# Patient Record
Sex: Female | Born: 1985 | Race: Black or African American | Hispanic: No | Marital: Single | Smoking: Never smoker
Health system: Southern US, Community
[De-identification: ages and names within clinical notes are randomized; demographics above are authoritative.]

---

## 2012-02-23 ENCOUNTER — Ambulatory Visit: Payer: 59

## 2012-02-24 ENCOUNTER — Ambulatory Visit (INDEPENDENT_AMBULATORY_CARE_PROVIDER_SITE_OTHER): Payer: 59 | Admitting: Family Medicine

## 2012-02-24 VITALS — BP 98/70 | HR 88 | Temp 99.1°F | Resp 18 | Ht 69.0 in | Wt 215.0 lb

## 2012-02-24 DIAGNOSIS — N898 Other specified noninflammatory disorders of vagina: Secondary | ICD-10-CM

## 2012-02-24 DIAGNOSIS — L738 Other specified follicular disorders: Secondary | ICD-10-CM

## 2012-02-24 DIAGNOSIS — R1030 Lower abdominal pain, unspecified: Secondary | ICD-10-CM

## 2012-02-24 DIAGNOSIS — A499 Bacterial infection, unspecified: Secondary | ICD-10-CM

## 2012-02-24 DIAGNOSIS — B9689 Other specified bacterial agents as the cause of diseases classified elsewhere: Secondary | ICD-10-CM

## 2012-02-24 DIAGNOSIS — Z3009 Encounter for other general counseling and advice on contraception: Secondary | ICD-10-CM

## 2012-02-24 DIAGNOSIS — R109 Unspecified abdominal pain: Secondary | ICD-10-CM

## 2012-02-24 DIAGNOSIS — Z113 Encounter for screening for infections with a predominantly sexual mode of transmission: Secondary | ICD-10-CM

## 2012-02-24 DIAGNOSIS — Z23 Encounter for immunization: Secondary | ICD-10-CM

## 2012-02-24 DIAGNOSIS — N760 Acute vaginitis: Secondary | ICD-10-CM

## 2012-02-24 DIAGNOSIS — L739 Follicular disorder, unspecified: Secondary | ICD-10-CM

## 2012-02-24 LAB — POCT WET PREP WITH KOH
Clue Cells Wet Prep HPF POC: 100
Trichomonas, UA: NEGATIVE
Yeast Wet Prep HPF POC: NEGATIVE

## 2012-02-24 MED ORDER — METRONIDAZOLE 500 MG PO TABS: 500.0000 mg | ORAL_TABLET | Freq: Two times a day (BID) | ORAL | Status: DC

## 2012-02-24 MED ORDER — NORGESTIM-ETH ESTRAD TRIPHASIC 0.18/0.215/0.25 MG-35 MCG PO TABS: 1.0000 | ORAL_TABLET | Freq: Every day | ORAL | Status: DC

## 2012-02-24 NOTE — Progress Notes (Signed)
Reviewed and agree.

## 2012-02-24 NOTE — Progress Notes (Signed)
[address]   [place], [place]  [address]   [phone]  Subjective:    Patient ID: Sylvia Bridges, female    DOB: Oct 18, 1985, 27 y.o.   MRN: [id number]  HPIThis 27 y.o. female presents for evaluation of vaginal discharge, knots in suprapubic region, lower abdominal pain.  No fever/chills/sweats. No nausea/vomiting/diarrhea/constipation.  Dysuria one week ago; no urgency, frequency, hematuria, flank pain.  Vaginal discharge two weeks.  Yellow-white discharge with odor creamy; +itching vaginally; no burning.  No new sexual partner; current partner 4-5 months.  No history of STD.  Last screening 12/2011.  LMP 02-11-12 normal.  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  Pap due this month.    2.  Immunizations:  has not received flu vaccine yet at work.   Review of Systems  Constitutional: Negative for fever, chills, diaphoresis and fatigue.  Gastrointestinal: Positive for abdominal pain. Negative for nausea, vomiting, diarrhea, constipation and abdominal distention.  Genitourinary: Positive for vaginal discharge, genital sores and pelvic pain. Negative for dysuria, urgency, frequency, hematuria, flank pain, vaginal bleeding, vaginal pain and menstrual problem.  Skin: Positive for wound.        History reviewed. No pertinent past medical history.  History reviewed. No pertinent past surgical history.  Prior to Admission medications   Not on File    No Known Allergies  History   Social History  . Marital Status: Single    Spouse Name: N/A    Number of Children: N/A  . Years of Education: N/A   Occupational History  . Not on file.   Social History Main Topics  . Smoking status: Never Smoker   . Smokeless tobacco: Not on file  . Alcohol Use: No  . Drug Use: No  . Sexually Active: Yes   Other Topics Concern  . 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 Diabetes Mother   . Hypertension Father   . Diabetes Maternal Grandmother   . Cancer Maternal Grandfather   . Cancer Paternal Grandfather   . Hypertension Paternal Grandfather     Objective:   Physical Exam  Nursing note and vitals reviewed. Constitutional: She is oriented to person, place, and time. She appears well-developed and well-nourished. No distress.  Cardiovascular: Normal rate and regular rhythm.   Pulmonary/Chest: Effort normal and breath sounds normal.  Abdominal: Soft. Bowel sounds are normal. There is no tenderness. There is no rebound and no guarding.  Genitourinary: Uterus normal. There is no rash, tenderness or lesion on the right labia. There is no rash, tenderness or lesion on the left labia. No erythema, tenderness or bleeding around the vagina. No foreign body around the vagina. Vaginal discharge found.       White yellow discharge small amount with foul odor.  Neurological: She is alert and oriented to person, place, and time.  Skin: She is not diaphoretic.       L inguinal fold with follicular prominence 5mm without fluctuants, tenderness.  No pustule.  Psychiatric: She has a normal mood and affect. Her behavior is normal.    INFLUENZA VACCINE ADMINISTERED.  Results for orders placed in visit on 02/24/12  POCT WET PREP WITH KOH      Component Value Range   Trichomonas, UA Negative     Clue Cells Wet Prep HPF POC 100%     Epithelial Wet Prep HPF POC 3-6     Yeast Wet Prep HPF POC neg     Bacteria Wet Prep HPF POC 2+     RBC Wet Prep HPF POC 0-1     WBC Wet Prep HPF POC neg     KOH Prep POC Negative      Assessment & Plan:   1. Vaginal discharge  POCT Wet Prep with KOH, GC/chlamydia probe amp, genital, RPR,  HIV antibody  2. Lower abdominal pain  POCT Wet Prep with KOH, GC/chlamydia probe amp, genital, RPR, HIV antibody  3. Screen for STD (sexually transmitted disease)  GC/chlamydia probe amp, genital, RPR, HIV antibody  4. Need for prophylactic vaccination and inoculation against influenza  Flu vaccine greater than or equal to 3yo preservative free IM  5. Bacterial vaginosis    6. Folliculitis    7. General counseling and advice on female contraception       1. Bacterial Vaginosis:  New. Rx for Metronidazole 500mg  bid x 7 days.  GC/Chlam pending.   2.  STD screening:  New. Counseling provided. Recommend regular condom use.  Obtain GC/Chlam/RPR/HIV. 3.  Folliculitis/follicular prominence:  New.  L inguinal fold; reassurance provided.  Heat to area if becomes painful. 4.  Contraception Counseling and Management:  New.  Rx for Orthotricyclen provided. 5. S/p influenza vaccine.    Meds ordered this encounter  Medications  . Norgestimate-Ethinyl Estradiol Triphasic (ORTHO TRI-CYCLEN, 28,) 0.18/0.215/0.25 MG-35 MCG tablet    Sig: Take 1 tablet by mouth daily.    Dispense:  1 Package    Refill:  11  . metroNIDAZOLE (FLAGYL) 500 MG tablet    Sig: Take 1 tablet (500 mg total) by mouth 2 (two) times daily with a meal. DO NOT CONSUME ALCOHOL WHILE TAKING THIS MEDICATION.    Dispense:  14 tablet    Refill:  0

## 2012-02-24 NOTE — Patient Instructions (Addendum)
1. Vaginal discharge  POCT Wet Prep with KOH, GC/chlamydia probe amp, genital, RPR, HIV antibody  2. Lower abdominal pain  POCT Wet Prep with KOH, GC/chlamydia probe amp, genital, RPR, HIV antibody  3. Screen for STD (sexually transmitted disease)  GC/chlamydia probe amp, genital, RPR, HIV antibody  4. Need for prophylactic vaccination and inoculation against influenza  Flu vaccine greater than or equal to 25yo preservative free IM  5. Bacterial vaginosis    6. Folliculitis    7. General counseling and advice on female contraception

## 2012-02-25 LAB — GC/CHLAMYDIA PROBE AMP: GC Probe RNA: NEGATIVE

## 2014-12-01 ENCOUNTER — Emergency Department
Admission: EM | Admit: 2014-12-01 | Discharge: 2014-12-01 | Disposition: A | Discharge status: Home / Self Care | Payer: 59 | Source: Home / Self Care | Attending: Family Medicine | Admitting: Family Medicine

## 2014-12-01 ENCOUNTER — Emergency Department (INDEPENDENT_AMBULATORY_CARE_PROVIDER_SITE_OTHER): Payer: 59

## 2014-12-01 ENCOUNTER — Encounter: Payer: Self-pay | Admitting: *Deleted

## 2014-12-01 DIAGNOSIS — M25562 Pain in left knee: Secondary | ICD-10-CM

## 2014-12-01 DIAGNOSIS — M7652 Patellar tendinitis, left knee: Secondary | ICD-10-CM

## 2014-12-01 MED ORDER — MELOXICAM 7.5 MG PO TABS: 15.0000 mg | ORAL_TABLET | Freq: Every day | ORAL | Status: DC

## 2014-12-01 NOTE — ED Notes (Signed)
Pt c/o LT knee pain and swelling intermittently x 6 mths after exercising. She has taken IBF with some relief.

## 2014-12-01 NOTE — ED Provider Notes (Signed)
CSN: [id number]     Arrival date & time 12/01/14  1802 History   First MD Initiated Contact with Patient 12/01/14 1838     Chief Complaint  Patient presents with  . Knee Pain   (Consider location/radiation/quality/duration/timing/severity/associated sxs/prior Treatment) HPI Pt is a 29yo female presenting [facility] with c/o intermittent, gradually worsening Left anterior knee pain that started 6 months ago.  Pain is worse after exercising. Pt does a mix of trail running, running on the treadmill and Zumba dance classes.  She states she use to walk the trail but started to run it and noticed the pain when she runs.  Denies known injury. Pain has been gradual. Improves with ibuprofen and rest. Pt bought a knee brace yesterday that provides some relief. Denies any other injuries. No prior knee injuries or surgeries.  History reviewed. No pertinent past medical history. History reviewed. No pertinent past surgical history. Family History  Problem Relation Age of Onset  . Diabetes Mother   . Hypertension Father   . Diabetes Maternal Grandmother   . Cancer Maternal Grandfather   . Cancer Paternal Grandfather   . Hypertension Paternal Grandfather    Social History  Substance Use Topics  . Smoking status: Never Smoker   . Smokeless tobacco: None  . Alcohol Use: No   OB History    No data available     Review of Systems  Musculoskeletal: Positive for myalgias, joint swelling and arthralgias.       Left knee  Skin: Negative for color change, rash and wound.    Allergies  Review of patient's allergies indicates no known allergies.  Home Medications   Prior to Admission medications   Medication Sig Start Date End Date Taking? Authorizing Provider  meloxicam (MOBIC) 7.5 MG tablet Take 2 tablets (15 mg total) by mouth daily. 15mg  (2 tabs) daily for 5 days, 7.5mg  (1 tab) daily for pain 12/01/14   Junius FinnerErin O'Malley, PA-C  Norgestimate-Ethinyl Estradiol Triphasic (ORTHO TRI-CYCLEN, 28,)  0.18/0.215/0.25 MG-35 MCG tablet Take 1 tablet by mouth daily. 02/24/12   Ethelda ChickKristi M Smith, MD   Meds Ordered and Administered this Visit  Medications - No data to display  BP 112/68 mmHg  Pulse 86  Temp(Src) 98.3 F (36.8 C) (Oral)  Resp 16  Ht 5\' 9"  (1.753 m)  Wt 214 lb (97.07 kg)  BMI 31.59 kg/m2  SpO2 100%  LMP 11/23/2014 No data found.   Physical Exam  Constitutional: She is oriented to person, place, and time. She appears well-developed and well-nourished.  HENT:  Head: Normocephalic and atraumatic.  Eyes: EOM are normal.  Neck: Normal range of motion.  Cardiovascular: Normal rate.   Pulmonary/Chest: Effort normal.  Musculoskeletal: Normal range of motion. She exhibits tenderness. She exhibits no edema.  Left knee: no edema. Full ROM, mild crepitus with full extension and flexion. Tenderness over patella tendon. No joint space tenderness. No posterior knee tenderness. Calf is soft, non-tender.  Neurological: She is alert and oriented to person, place, and time.  Normal gait.  Skin: Skin is warm and dry.  Left knee: skin in tact. No ecchymosis or erythema.  Psychiatric: She has a normal mood and affect. Her behavior is normal.  Nursing note and vitals reviewed.   ED Course  Procedures (including critical care time)  Labs Review Labs Reviewed - No data to display  Imaging Review Dg Knee Complete 4 Views Left  12/01/2014  CLINICAL DATA:  29 year old female with acute left knee pain for 1  day. Initial encounter. EXAM: LEFT KNEE - COMPLETE 4+ VIEW COMPARISON:  None. FINDINGS: There is no evidence of fracture, dislocation, or joint effusion. There is no evidence of arthropathy or other focal bone abnormality. Soft tissues are unremarkable. IMPRESSION: Negative. Electronically Signed   By: Harmon Pier M.D.   On: 12/01/2014 18:46      MDM   1. Left knee pain   2. Patellar tendonitis of left knee    Left knee pain, tenderness over patella tendon. Doubt gout or septic  joint. Likely overuse injury. Tx: knee sleeve, meloxicam, home exercises as tolerated. Advised to discontinue running for 1-2 weeks. F/u with Sports Medicine in 1-2 weeks if not improving. Patient verbalized understanding and agreement with treatment plan.     Junius Finner, PA-C 12/01/14 1907

## 2016-02-12 IMAGING — CR DG KNEE COMPLETE 4+V*L*
4 series · 4 of 4 positions shown · non-contrast
Comparison: None.

CLINICAL DATA: 29-year-old female with acute left knee pain for 1
day. Initial encounter.

EXAM:
LEFT KNEE - COMPLETE 4+ VIEW

[knee ap]
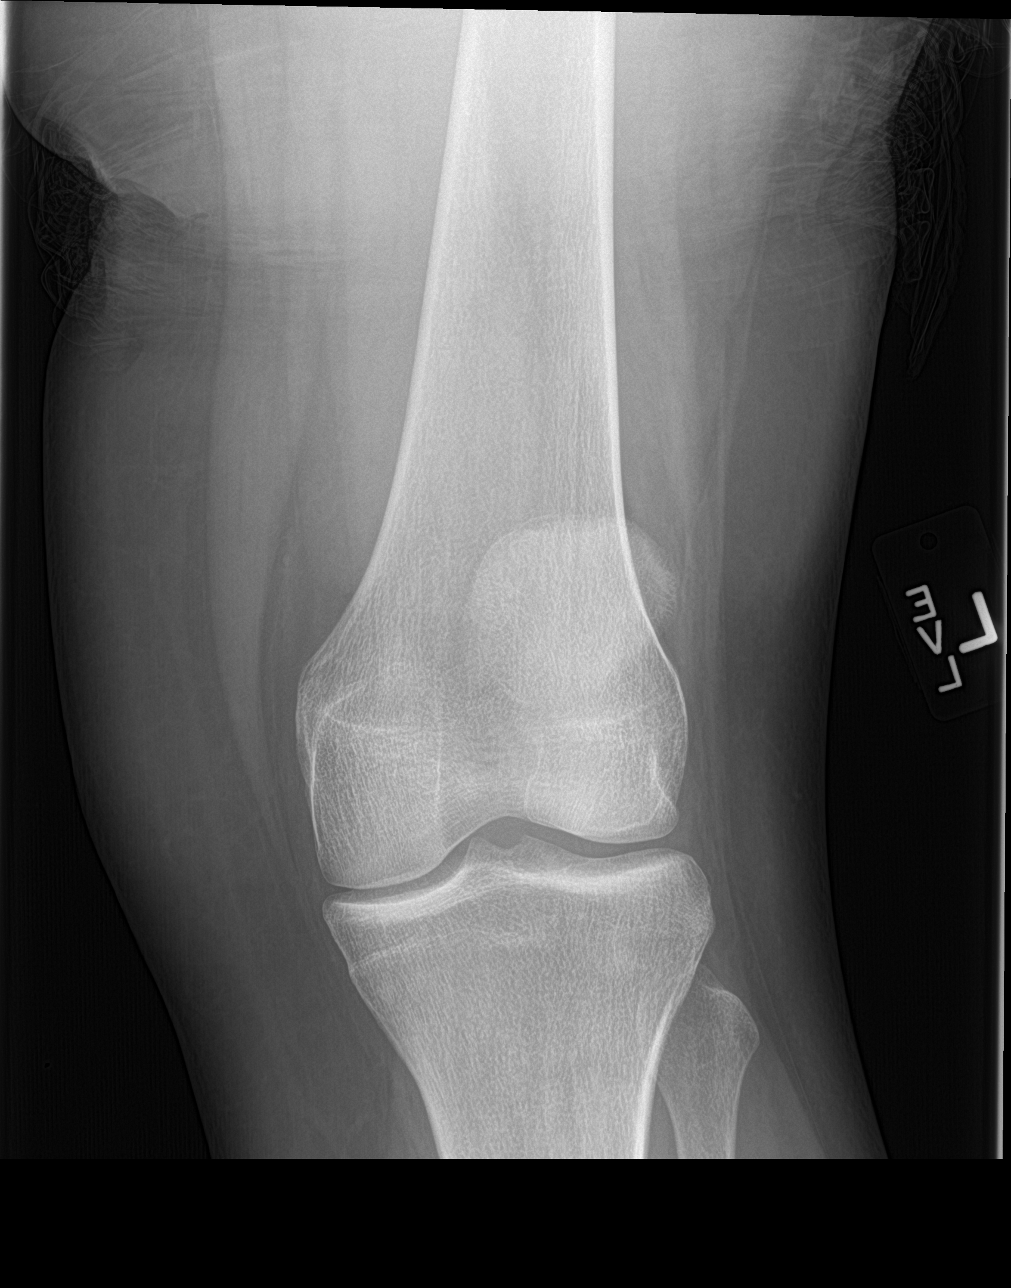

[tunnel]
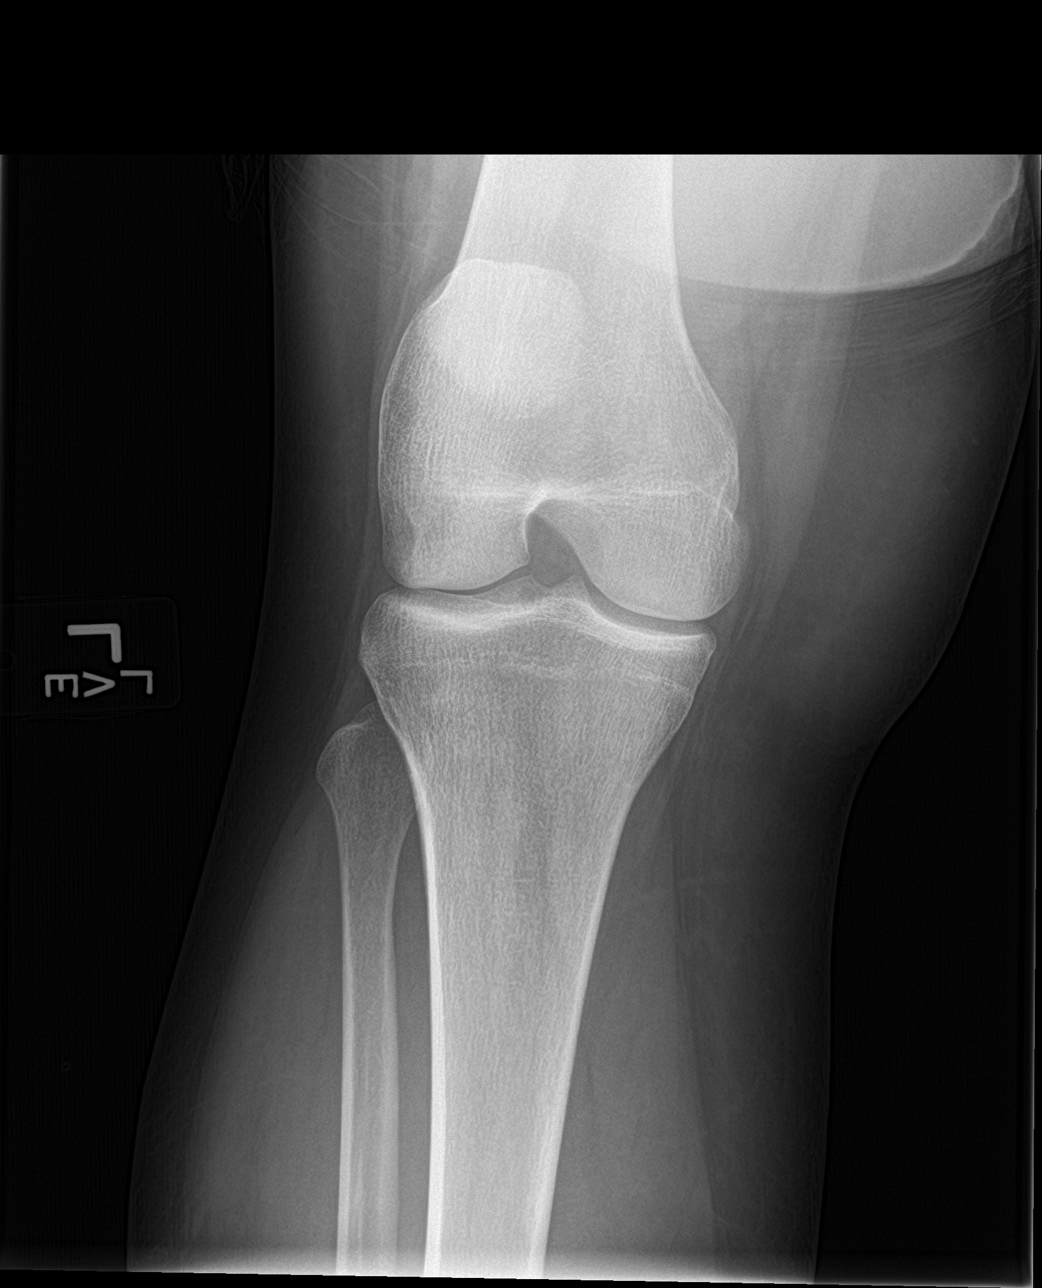

[knee lat]
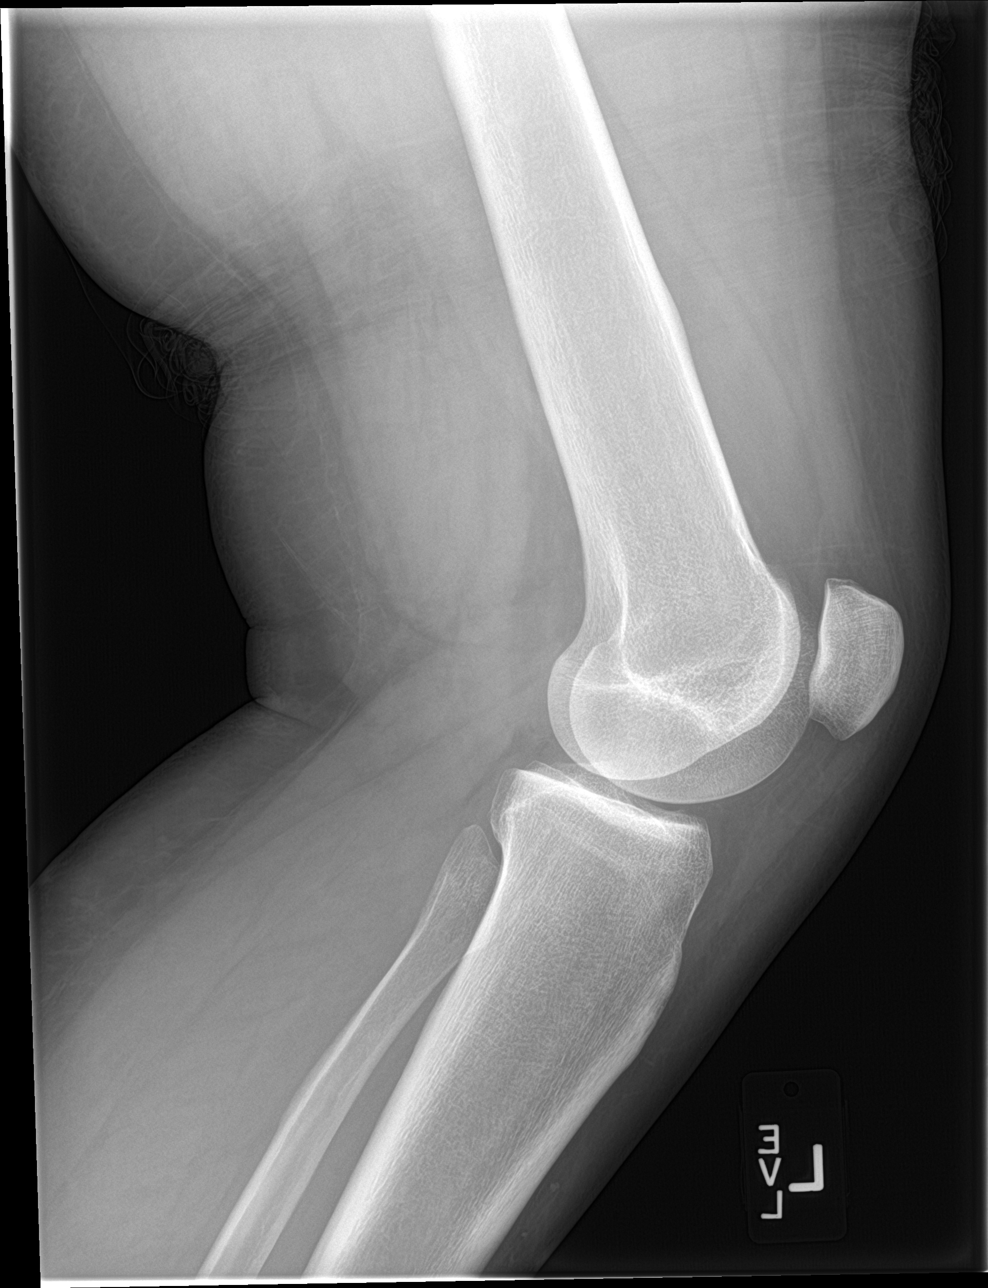

[knee sunrise]
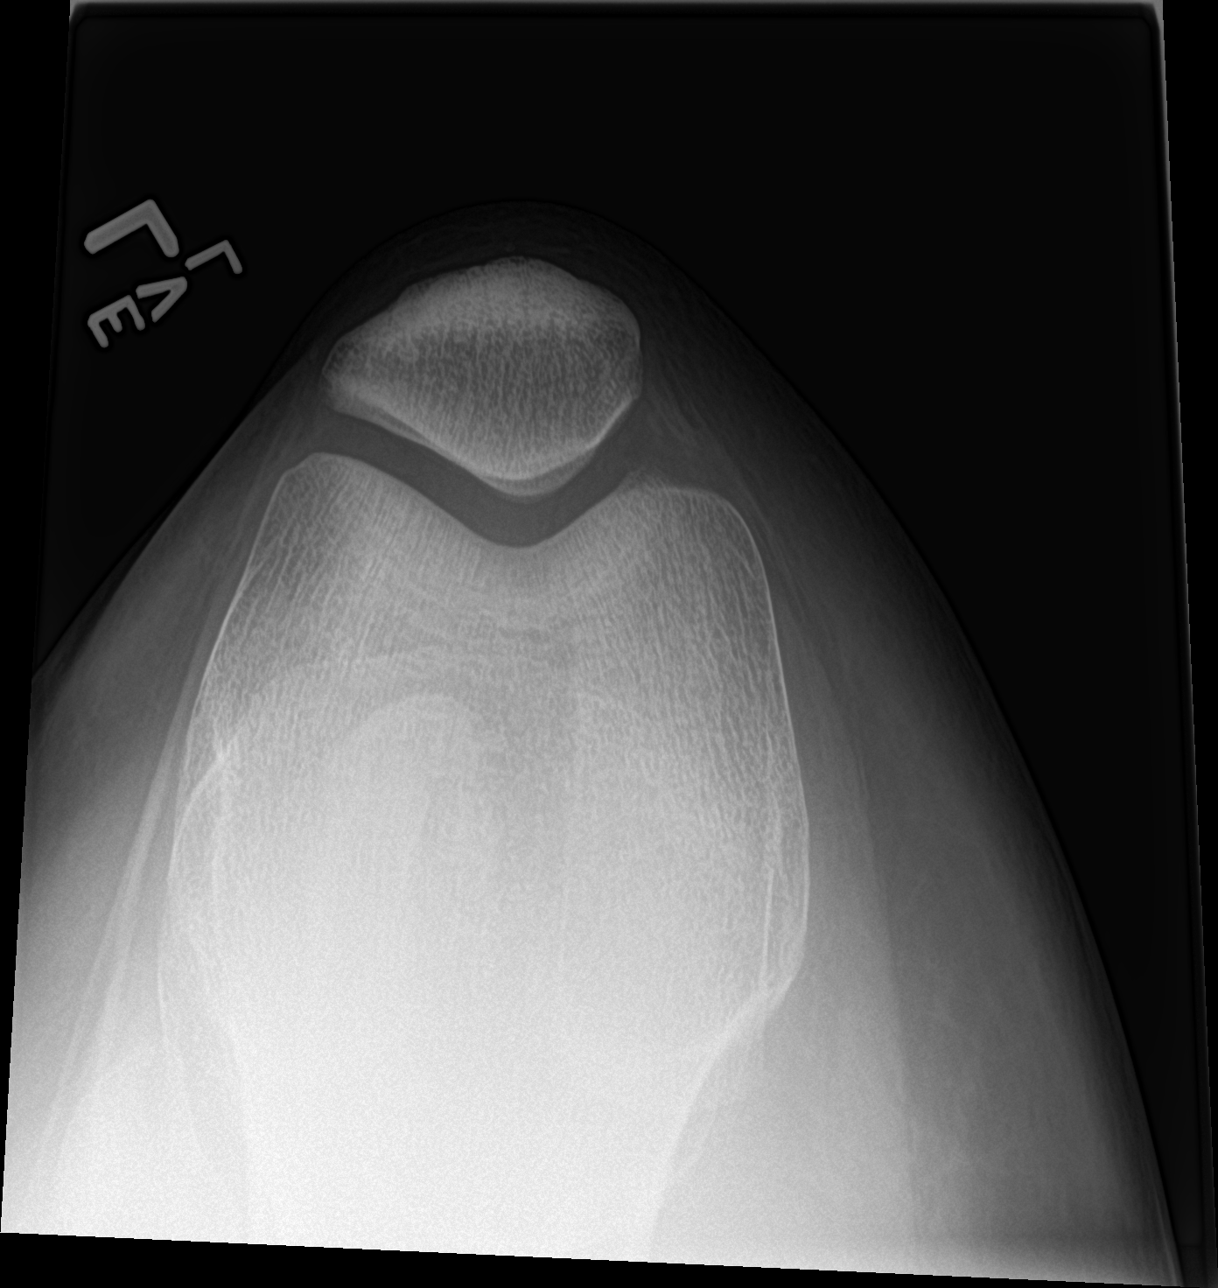

[4 of 4 positions shown; findings below may reference images not displayed]

FINDINGS: There is no evidence of fracture, dislocation, or joint effusion.
There is no evidence of arthropathy or other focal bone abnormality.
Soft tissues are unremarkable.
IMPRESSION: Negative.

## 2017-03-30 ENCOUNTER — Encounter: Payer: Self-pay | Admitting: Emergency Medicine

## 2017-03-30 ENCOUNTER — Emergency Department
Admission: EM | Admit: 2017-03-30 | Discharge: 2017-03-30 | Disposition: A | Discharge status: Home / Self Care | Payer: BLUE CROSS/BLUE SHIELD | Source: Home / Self Care | Attending: Emergency Medicine | Admitting: Emergency Medicine

## 2017-03-30 DIAGNOSIS — H5711 Ocular pain, right eye: Secondary | ICD-10-CM

## 2017-03-30 DIAGNOSIS — J309 Allergic rhinitis, unspecified: Secondary | ICD-10-CM

## 2017-03-30 DIAGNOSIS — Z209 Contact with and (suspected) exposure to unspecified communicable disease: Secondary | ICD-10-CM

## 2017-03-30 NOTE — ED Provider Notes (Signed)
[facility]    CSN: [id number] Arrival date & time: 03/30/17  0920     History   Chief Complaint Chief Complaint  Patient presents with  . URI    HPI Sylvia Bridges is a 32 y.o. female.  Patient states she has been ill for the last 2 weeks. She has had headache, pressure sensation, sneezing, and a dry nonproductive cough. Yesterday she noticed some redness in her right eye which has been associated with pain. She has been using clear eye eyedrops. 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 She is concerned about a possible exposure in her office. She was last sexually active in the end of December without protection. She does have a Mirena for birth control. HPI  History reviewed. No pertinent past medical history.  There are no active problems to display for this patient.   History reviewed. No pertinent surgical history.  OB History    No data available       Home Medications    Prior to Admission medications   Medication Sig Start Date End Date Taking? Authorizing Provider  levonorgestrel (MIRENA) 20 MCG/24HR IUD 1 each by Intrauterine route once.   Yes [provider]    Family History Family History  Problem Relation Age of Onset  . Diabetes Mother   . Hypertension Father   . Diabetes Maternal Grandmother   . Cancer Maternal Grandfather   . Cancer Paternal Grandfather   . Hypertension Paternal Grandfather     Social History Social History   Tobacco Use  . Smoking status: Never Smoker  . Smokeless tobacco: Never Used  Substance Use Topics  . Alcohol use: Yes    Comment: occasionally  . Drug use: No     Allergies   Patient has no known allergies.   Review of Systems Review of Systems  Constitutional: Negative.   HENT: Positive for congestion.   Eyes: Positive for pain and redness.  Respiratory: Positive for cough. Negative for wheezing.     Cardiovascular: Negative.   Gastrointestinal: Negative.   Genitourinary: Negative.      Physical Exam Triage Vital Signs ED Triage Vitals  Enc Vitals Group     BP 03/30/17 0944 113/75     Pulse Rate 03/30/17 0944 66     Resp --      Temp 03/30/17 0944 98.2 F (36.8 C)     Temp Source 03/30/17 0944 Oral     SpO2 03/30/17 0944 99 %     Weight 03/30/17 0945 219 lb 8 oz (99.6 kg)     Height 03/30/17 0945 5\' 9"  (1.753 m)     Head Circumference --      Peak Flow --      Pain Score 03/30/17 0945 7     Pain Loc --      Pain Edu? --      Excl. in GC? --    No data found.  Updated Vital Signs BP 113/75 (BP Location: Right Arm)   Pulse 66   Temp 98.2 F (36.8 C) (Oral)   Ht 5\' 9"  (1.753 m)   Wt 219 lb 8 oz (99.6 kg)   SpO2 99%   BMI 32.41 kg/m   Visual Acuity Right Eye Distance: 20/100 Left Eye Distance: 20/25 Bilateral Distance: 20/25  Right Eye Near:   Left Eye Near:    Bilateral Near:     Physical Exam  Constitutional: She appears well-developed and well-nourished.  HENT:  Head: Normocephalic.  Eyes: Pupils are equal, round, and reactive to light.  There is no redness of the conjunctiva. Disc margins are sharp. There is no discharge. There is no palpable preauricular node.  Neck: Normal range of motion. Neck supple.  Cardiovascular: Regular rhythm.  Pulmonary/Chest: Effort normal and breath sounds normal.  Procedure note. 3 drops of numbing medication placed in the right eye. Flourescein stain applied. Eye flushed with normal saline. There was no corneal uptake noted.   UC Treatments / Results  Labs (all labs ordered are listed, but only abnormal results are displayed) Labs Reviewed  GC/CHLAMYDIA PROBE AMP  RPR  HIV ANTIBODY (ROUTINE TESTING)  HSV(HERPES SIMPLEX VRS) I + II AB-IGG  HEPATITIS C ANTIBODY    EKG  EKG Interpretation None       Radiology No results found.  Procedures Procedures (including critical care time)  Medications  Ordered in UC Medications - No data to display   Initial Impression / Assessment and Plan / UC Course  I have reviewed the triage vital signs and the nursing notes.  Pertinent labs & imaging results that were available during my care of the patient were reviewed by me and considered in my medical decision making (see chart for details). This patient's primary symptoms are allergy related. She has right eye pain and is concerned about possible STD exposure. 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 She is concerned about possible contact to her. She does not recall any direct incident. She also is sexually active with last exposure and of December without protection. She would like to have her STD testing done. All tests will be done. She will take Zyrtec and Flonase as needed for allergy symptoms. She will follow-up with the ophthalmologist if her pain is persistent.     Final Clinical Impressions(s) / UC Diagnoses   Final diagnoses:  Eye pain, right  Recent exposure to communicable disease  Allergic rhinitis, unspecified seasonality, unspecified trigger    ED Discharge Orders    None       Controlled Substance Prescriptions [place] Controlled Substance Registry consulted? Not Applicable   Collene Gobbleaub, Quintasha Gren A, MD 03/30/17 1034

## 2017-03-30 NOTE — Discharge Instructions (Signed)
We will call you with culture results. Try ibuprofen or Tylenol for your right eye discomfort. If your pain is persistent please follow-up with an ophthalmologist on Monday. Take Zyrtec one at night and Flonase 2 puffs each nares once a day for symptoms.

## 2017-03-30 NOTE — ED Triage Notes (Signed)
Patient complaining of head pressure x 2 weeks, sneezing, non-productive cough, right eye redness x 1 day which is painful, itchy

## 2017-04-01 LAB — HSV(HERPES SIMPLEX VRS) I + II AB-IGG: HAV 1 IGG,TYPE SPECIFIC AB: <0.9 index

## 2017-04-01 LAB — HIV ANTIBODY (ROUTINE TESTING W REFLEX): HIV 1&2 Ab, 4th Generation: NONREACTIVE

## 2017-04-01 LAB — HEPATITIS C ANTIBODY
HEP C AB: NONREACTIVE
SIGNAL TO CUT-OFF: 0.01 (ref <1.00)

## 2017-04-01 LAB — RPR: RPR Ser Ql: NONREACTIVE

## 2017-04-02 ENCOUNTER — Telehealth: Payer: Self-pay

## 2017-04-02 LAB — C. TRACHOMATIS/N. GONORRHOEAE RNA
C. trachomatis RNA, TMA: NOT DETECTED
N. GONORRHOEAE RNA, TMA: NOT DETECTED

## 2017-04-02 NOTE — Telephone Encounter (Signed)
Attempted to call - VM not set up.

## 2017-04-03 ENCOUNTER — Telehealth: Payer: Self-pay

## 2017-04-03 NOTE — Telephone Encounter (Signed)
Called patient, gave password, lab results given.

## 2021-07-29 ENCOUNTER — Emergency Department
Admission: RE | Admit: 2021-07-29 | Discharge: 2021-07-29 | Disposition: A | Discharge status: Home / Self Care | Payer: 59 | Source: Ambulatory Visit

## 2021-07-29 VITALS — BP 119/75 | HR 96 | Temp 99.3°F | Resp 18 | Ht 68.0 in | Wt 207.0 lb

## 2021-07-29 DIAGNOSIS — K068 Other specified disorders of gingiva and edentulous alveolar ridge: Secondary | ICD-10-CM

## 2021-07-29 MED ORDER — NYSTATIN 100000 UNIT/ML MT SUSP: 500000.0000 [IU] | Freq: Four times a day (QID) | OROMUCOSAL | 0 refills | Status: AC

## 2021-07-29 NOTE — ED Provider Notes (Signed)
[facility]    CSN: [id number] Arrival date & time: 07/29/21  0945      History   Chief Complaint Chief Complaint  Patient presents with   Abscess    Oral abscess around gum - Entered by patient   Appointment    HPI Sylvia Bridges is a 36 y.o. female.   Pleasant 35yo female presents today with concern of white spots on her gums, over the R incisor. She states it has been there for 1-2 years. She feels that the lesions are getting bigger, but they are not spreading. This is the only location. Pt does not smoke. 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 No other pathology noted to the lymph nodes. She feels they are still swollen on the R. She recently went to her dentist and was told "I have no idea what those are." No treatments or workup was offered. 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 Pt is concerned this could be a viral lesion. She has no rash elsewhere. She states the gums are white but NOT painful. She takes daily birth control and was put on metformin by her Gyn one month ago for weight loss and "prediabetes." No other concerns at this time.   Abscess   History reviewed. No pertinent past medical history.  There are no problems to display for this patient.   History reviewed. No pertinent surgical history.  OB History   No obstetric history on file.      Home Medications    Prior to Admission medications   Medication Sig Start Date End Date Taking? Authorizing Provider  levonorgestrel (MIRENA) 20 MCG/24HR IUD 1 each by Intrauterine route once.   Yes [provider]  metFORMIN (GLUCOPHAGE-XR) 500 MG 24 hr tablet Take 1 tablet by mouth daily with breakfast. 06/29/21  Yes [provider]  nystatin (MYCOSTATIN) 100000 UNIT/ML suspension Take 5 mLs (500,000 Units total) by mouth 4 (four)  times daily. Swish in mouth for 5 minutes prior to spitting out 07/29/21  Yes Ashleah Valtierra L, PA    Family History Family History  Problem Relation Age of Onset   Diabetes Mother    Hypertension Father    Diabetes Maternal Grandmother    Cancer Maternal Grandfather    Cancer Paternal Grandfather    Hypertension Paternal Grandfather     Social History Social History   Tobacco Use   Smoking status: Never   Smokeless tobacco: Never  Substance Use Topics   Alcohol use: Yes    Comment: occasionally   Drug use: No     Allergies   Patient has no known allergies.   Review of Systems Review of Systems As per hpi  Physical Exam Triage Vital Signs ED Triage Vitals [07/29/21 1004]  Enc Vitals Group     BP 119/75     Pulse Rate 96     Resp 18     Temp 99.3 F (37.4 C)     Temp Source Oral     SpO2 97 %     Weight 207 lb (93.9 kg)     Height 5\' 8"  (1.727 m)     Head Circumference      Peak Flow      Pain Score 0     Pain Loc  Pain Edu?      Excl. in GC?    No data found.  Updated Vital Signs BP 119/75 (BP Location: Right Arm)   Pulse 96   Temp 99.3 F (37.4 C) (Oral)   Resp 18   Ht 5\' 8"  (1.727 m)   Wt 207 lb (93.9 kg)   SpO2 97%   BMI 31.47 kg/m   Visual Acuity Right Eye Distance:   Left Eye Distance:   Bilateral Distance:    Right Eye Near:   Left Eye Near:    Bilateral Near:     Physical Exam Vitals and nursing note reviewed.  Constitutional:      General: She is not in acute distress.    Appearance: Normal appearance. She is normal weight. She is not ill-appearing or toxic-appearing.  HENT:     Head: Normocephalic and atraumatic.     Right Ear: Tympanic membrane, ear canal and external ear normal. There is no impacted cerumen.     Left Ear: Tympanic membrane, ear canal and external ear normal.     Nose: Nose normal. No congestion or rhinorrhea.     Mouth/Throat:     Mouth: Mucous membranes are moist. No injury, lacerations, oral  lesions or angioedema.     Dentition: Normal dentition. Gum lesions (several white plaques on an erythematous base to gums superior to R incisor) present.     Tongue: No lesions.     Palate: No mass and lesions.     Pharynx: Oropharynx is clear. Uvula midline. No pharyngeal swelling, oropharyngeal exudate or uvula swelling.   Neck:     Comments: Well healed surgical scar R neck Cardiovascular:     Rate and Rhythm: Normal rate and regular rhythm.     Pulses: Normal pulses.  Pulmonary:     Effort: Pulmonary effort is normal. No respiratory distress.  Musculoskeletal:     Cervical back: Normal range of motion and neck supple. No rigidity or tenderness.  Lymphadenopathy:     Cervical: No cervical adenopathy.  Neurological:     Mental Status: She is alert.      UC Treatments / Results  Labs (all labs ordered are listed, but only abnormal results are displayed) Labs Reviewed  CBC WITH DIFFERENTIAL/PLATELET  HIV ANTIBODY (ROUTINE TESTING W REFLEX)  RPR    EKG   Radiology No results found.  Procedures Procedures (including critical care time)  Medications Ordered in UC Medications - No data to display  Initial Impression / Assessment and Plan / UC Course  I have reviewed the triage vital signs and the nursing notes.  Pertinent labs & imaging results that were available during my care of the patient were reviewed by me and considered in my medical decision making (see chart for details).     Gum lesion - has been there for 1+ years. Unlikely to be thrush, but will do trial of nystatin swish and spit. Pt has a follow up with her dentist scheduled for July. Will obtain labs today to assess for infection (CBC, HIV, RPR). Encouraged f/u with ENT or dentist for possible bx to rule out precancerous gum lesion.  Final Clinical Impressions(s) / UC Diagnoses   Final diagnoses:  Gum lesion     Discharge Instructions      We have obtained labs today to assess for possible  causes of your gum lesion. Please start nystatin swish and spit to see if there could be a candidal component to your gums (yeast.) Swish for  5 minutes, then spit out. Please follow up with your dentist. There is a remote possibility of this being Fordyce granules in which nothing needs to be done. They are benign. However, a feel a biopsy may be beneficial to rule out lichen planus or leukoplakia. Please call your dentist and ENT to determine who can perform the biopsy.    ED Prescriptions     Medication Sig Dispense Auth. Provider   nystatin (MYCOSTATIN) 100000 UNIT/ML suspension Take 5 mLs (500,000 Units total) by mouth 4 (four) times daily. Swish in mouth for 5 minutes prior to spitting out 480 mL Jesslyn Viglione L, PA      PDMP not reviewed this encounter.   Maretta BeesCrain, Shantae Vantol L, [place] 07/29/21 1053

## 2021-07-29 NOTE — Discharge Instructions (Addendum)
We have obtained labs today to assess for possible causes of your gum lesion. Please start nystatin swish and spit to see if there could be a candidal component to your gums (yeast.) Swish for 5 minutes, then spit out. Please follow up with your dentist. There is a remote possibility of this being Fordyce granules in which nothing needs to be done. They are benign. However, a feel a biopsy may be beneficial to rule out lichen planus or leukoplakia. Please call your dentist and ENT to determine who can perform the biopsy.

## 2021-07-29 NOTE — ED Triage Notes (Signed)
Patient c/o possible abscess in her mouth upper right side for over one year.  Patient has seen her dentist, dermatologist and GYN and can't seem to get a clear answer.  The area is not painful.

## 2021-08-01 LAB — CBC WITH DIFFERENTIAL/PLATELET
Absolute Monocytes: 292 cells/uL (ref 200–950)
Basophils Absolute: 32 cells/uL (ref 0–200)
Basophils Relative: 0.6 %
Eosinophils Absolute: 227 cells/uL (ref 15–500)
Eosinophils Relative: 4.2 %
HCT: 39.1 % (ref 35.0–45.0)
Hemoglobin: 12.5 g/dL (ref 11.7–15.5)
Lymphs Abs: 1247 cells/uL (ref 850–3900)
MCH: 28.3 pg (ref 27.0–33.0)
MCHC: 32 g/dL (ref 32.0–36.0)
MCV: 88.7 fL (ref 80.0–100.0)
MPV: 11.6 fL (ref 7.5–12.5)
Monocytes Relative: 5.4 %
Neutro Abs: 3602 cells/uL (ref 1500–7800)
Neutrophils Relative %: 66.7 %
Platelets: 288 10*3/uL (ref 140–400)
RBC: 4.41 10*6/uL (ref 3.80–5.10)
RDW: 10.5 % — ABNORMAL LOW (ref 11.0–15.0)
Total Lymphocyte: 23.1 %
WBC: 5.4 10*3/uL (ref 3.8–10.8)

## 2021-08-01 LAB — HIV ANTIBODY (ROUTINE TESTING W REFLEX): HIV 1&2 Ab, 4th Generation: NONREACTIVE

## 2021-08-01 LAB — RPR: RPR Ser Ql: NONREACTIVE
